# Patient Record
Sex: Male | Born: 1940 | Race: White | Hispanic: No | Marital: Married | Smoking: Former smoker
Health system: Southern US, Community
[De-identification: ages and names within clinical notes are randomized; demographics above are authoritative.]

## PROBLEM LIST (undated history)

## (undated) DIAGNOSIS — E785 Hyperlipidemia, unspecified: Secondary | ICD-10-CM

## (undated) DIAGNOSIS — J302 Other seasonal allergic rhinitis: Secondary | ICD-10-CM

## (undated) DIAGNOSIS — I10 Essential (primary) hypertension: Secondary | ICD-10-CM

## (undated) DIAGNOSIS — E119 Type 2 diabetes mellitus without complications: Secondary | ICD-10-CM

## (undated) DIAGNOSIS — N529 Male erectile dysfunction, unspecified: Secondary | ICD-10-CM

## (undated) DIAGNOSIS — M1990 Unspecified osteoarthritis, unspecified site: Secondary | ICD-10-CM

## (undated) HISTORY — PX: TONSILLECTOMY: SUR1361

## (undated) HISTORY — PX: HAND SURGERY: SHX662

## (undated) HISTORY — PX: CATARACT EXTRACTION: SUR2

## (undated) HISTORY — PX: VASECTOMY: SHX75

## (undated) HISTORY — PX: APPENDECTOMY: SHX54

---

## 2005-12-20 ENCOUNTER — Ambulatory Visit: Payer: Self-pay | Admitting: Orthopaedic Surgery

## 2005-12-20 ENCOUNTER — Other Ambulatory Visit: Payer: Self-pay

## 2005-12-22 ENCOUNTER — Ambulatory Visit: Payer: Self-pay | Admitting: Orthopaedic Surgery

## 2009-07-29 ENCOUNTER — Ambulatory Visit: Payer: Self-pay | Admitting: Unknown Physician Specialty

## 2010-08-25 ENCOUNTER — Emergency Department: Payer: Self-pay | Admitting: *Deleted

## 2010-09-02 ENCOUNTER — Emergency Department: Payer: Self-pay | Admitting: Emergency Medicine

## 2011-05-14 ENCOUNTER — Ambulatory Visit: Payer: Self-pay | Admitting: Gastroenterology

## 2011-07-26 ENCOUNTER — Ambulatory Visit: Payer: Self-pay | Admitting: Gastroenterology

## 2011-10-20 ENCOUNTER — Telehealth: Payer: Self-pay

## 2011-10-20 DIAGNOSIS — K6289 Other specified diseases of anus and rectum: Secondary | ICD-10-CM

## 2011-10-21 ENCOUNTER — Other Ambulatory Visit: Payer: Self-pay

## 2011-10-21 DIAGNOSIS — K6289 Other specified diseases of anus and rectum: Secondary | ICD-10-CM

## 2011-10-21 NOTE — Telephone Encounter (Signed)
Pt has been instructed and meds reviewed and pt will call with any questions or concerns

## 2011-10-21 NOTE — Telephone Encounter (Signed)
Left message on machine to call back  

## 2011-12-02 ENCOUNTER — Ambulatory Visit (HOSPITAL_COMMUNITY)
Admission: RE | Admit: 2011-12-02 | Discharge: 2011-12-02 | Disposition: A | Discharge status: Home / Self Care | Payer: Medicare Other | Source: Ambulatory Visit | Attending: Gastroenterology | Admitting: Gastroenterology

## 2011-12-02 ENCOUNTER — Encounter (HOSPITAL_COMMUNITY): Payer: Self-pay | Admitting: *Deleted

## 2011-12-02 ENCOUNTER — Encounter (HOSPITAL_COMMUNITY)
Admission: RE | Disposition: A | Discharge status: Home / Self Care | Payer: Self-pay | Source: Ambulatory Visit | Attending: Gastroenterology

## 2011-12-02 DIAGNOSIS — K620 Anal polyp: Secondary | ICD-10-CM | POA: Insufficient documentation

## 2011-12-02 DIAGNOSIS — R933 Abnormal findings on diagnostic imaging of other parts of digestive tract: Secondary | ICD-10-CM

## 2011-12-02 DIAGNOSIS — K6289 Other specified diseases of anus and rectum: Secondary | ICD-10-CM | POA: Insufficient documentation

## 2011-12-02 DIAGNOSIS — E119 Type 2 diabetes mellitus without complications: Secondary | ICD-10-CM | POA: Insufficient documentation

## 2011-12-02 DIAGNOSIS — I10 Essential (primary) hypertension: Secondary | ICD-10-CM | POA: Insufficient documentation

## 2011-12-02 DIAGNOSIS — E785 Hyperlipidemia, unspecified: Secondary | ICD-10-CM | POA: Insufficient documentation

## 2011-12-02 DIAGNOSIS — K621 Rectal polyp: Secondary | ICD-10-CM | POA: Insufficient documentation

## 2011-12-02 DIAGNOSIS — D214 Benign neoplasm of connective and other soft tissue of abdomen: Secondary | ICD-10-CM | POA: Insufficient documentation

## 2011-12-02 HISTORY — DX: Hyperlipidemia, unspecified: E78.5

## 2011-12-02 HISTORY — DX: Unspecified osteoarthritis, unspecified site: M19.90

## 2011-12-02 HISTORY — DX: Essential (primary) hypertension: I10

## 2011-12-02 HISTORY — PX: EUS: SHX5427

## 2011-12-02 HISTORY — DX: Type 2 diabetes mellitus without complications: E11.9

## 2011-12-02 SURGERY — ULTRASOUND, LOWER GI TRACT, ENDOSCOPIC
Anesthesia: Moderate Sedation

## 2011-12-02 MED ORDER — FENTANYL CITRATE 0.05 MG/ML IJ SOLN
INTRAMUSCULAR | Status: DC | PRN
  Administered 2011-12-02 (×3): 25 ug via INTRAVENOUS

## 2011-12-02 MED ORDER — MIDAZOLAM HCL 10 MG/2ML IJ SOLN
INTRAMUSCULAR | Status: DC | PRN
  Administered 2011-12-02 (×3): 2 mg via INTRAVENOUS

## 2011-12-02 MED ORDER — MIDAZOLAM HCL 10 MG/2ML IJ SOLN
INTRAMUSCULAR | Status: AC
  Filled 2011-12-02: qty 4

## 2011-12-02 MED ORDER — FENTANYL CITRATE 0.05 MG/ML IJ SOLN
INTRAMUSCULAR | Status: AC
  Filled 2011-12-02: qty 4

## 2011-12-02 MED ORDER — SODIUM CHLORIDE 0.9 % IV SOLN
INTRAVENOUS | Status: DC
  Administered 2011-12-02: 10:00:00 via INTRAVENOUS

## 2011-12-02 NOTE — H&P (Signed)
  HPI: This is a very pleasant man found to have rectosigmoid nodule 4/13 on first colonsocopy.  Follow up flex sig 7/13 showed the same wihtout changes.  Mucosal biopsies show no neoplasm    Past Medical History  Diagnosis Date  . Diabetes mellitus without complication   . Hypertension   . Hyperlipidemia   . Arthritis     hands, shoulder    Past Surgical History  Procedure Date  . Tonsillectomy   . Appendectomy   . Hand surgery     right hand, nodule removed on finger     Current Facility-Administered Medications  Medication Dose Route Frequency Provider Last Rate Last Dose  . 0.9 %  sodium chloride infusion   Intravenous Continuous Rachael Fee, MD 20 mL/hr at 12/02/11 0940      Allergies as of 10/21/2011  . (No Known Allergies)    History reviewed. No pertinent family history.  History   Social History  . Marital Status: Married    Spouse Name: N/A    Number of Children: N/A  . Years of Education: N/A   Occupational History  . Not on file.   Social History Main Topics  . Smoking status: Former Games developer  . Smokeless tobacco: Not on file  . Alcohol Use: 12.6 oz/week    21 Glasses of wine per week     Comment: every day  . Drug Use: No  . Sexually Active:    Other Topics Concern  . Not on file   Social History Narrative  . No narrative on file      Physical Exam: BP 177/97  Temp 97.5 F (36.4 C) (Oral)  Resp 13  Ht 5\' 8"  (1.727 m)  Wt 198 lb (89.812 kg)  BMI 30.11 kg/m2  SpO2 98% Constitutional: generally well-appearing Psychiatric: alert and oriented x3 Abdomen: soft, nontender, nondistended, no obvious ascites, no peritoneal signs, normal bowel sounds     Assessment and plan: 71 y.o. male with rectosigmoid nodule  For lower EUS today

## 2011-12-02 NOTE — Op Note (Signed)
[facility] [address] [place], [address]   ENDOSCOPIC ULTRASOUND PROCEDURE REPORT  PATIENT: Curtis, Krueger  MR#: [id number] BIRTHDATE: 08-05-1940  GENDER: Male ENDOSCOPIST: Rachael Fee, MD REFERRED BY:  Barnetta Chapel, MD at [facility] in [place], [place] PROCEDURE DATE:  12/02/2011 PROCEDURE:   Lower EUS, flex sig with polypectomy ASA CLASS:      Class II INDICATIONS:   rectosigmoid nodule noted on colonsocopy 04/2011, repeat exam 07/2011 (Dr.  Marva Panda). MEDICATIONS: Fentanyl 75 mcg IV and Versed 6 mg IV  DESCRIPTION OF PROCEDURE:   After the risks benefits and alternatives of the procedure were  explained, informed consent was obtained. The patient was then placed in the left, lateral, decubitus postion and IV sedation was administered. Throughout the procedure, the patients blood pressure, pulse and oxygen saturations were monitored continuously.  Under direct visualization, the EUS 110107  endoscope was introduced through the mouth  and advanced to the descending duodenum .  Water was used as necessary to provide an acoustic interface.  Upon completion of the imaging, water was removed and the patient was sent to the recovery room in satisfactory condition.    Sigmoidoscopic findings: 1. Polypoid nodule at 15cm from anus (recto-sigmoid verge). This was 8mm across. 2. Scattered 1mm hyperplastic appearing rectal polyps.  Otherwise, repeated examination to 35cm from anus found no other lesions.  EUS findings: 1. The nodule above correlated with a hypoechoic 5.74mm lesion, confined to mucosa layer of rectal wall. 2. No perirectal adenoapthy.  Following EUS examination, I elected to removed with nodule with snare/cautery and sent to pathology.  Await final pathology.    _______________________________ eSignedRachael Fee, MD 12/02/2011 10:27 AM

## 2011-12-03 ENCOUNTER — Encounter (HOSPITAL_COMMUNITY): Payer: Self-pay

## 2011-12-03 ENCOUNTER — Encounter (HOSPITAL_COMMUNITY): Payer: Self-pay | Admitting: Gastroenterology

## 2013-02-06 ENCOUNTER — Ambulatory Visit (INDEPENDENT_AMBULATORY_CARE_PROVIDER_SITE_OTHER): Payer: Medicare Other | Admitting: Podiatry

## 2013-02-06 ENCOUNTER — Encounter: Payer: Self-pay | Admitting: Podiatry

## 2013-02-06 ENCOUNTER — Ambulatory Visit (INDEPENDENT_AMBULATORY_CARE_PROVIDER_SITE_OTHER): Payer: Medicare Other

## 2013-02-06 VITALS — BP 148/78 | HR 94 | Resp 16 | Ht 68.0 in | Wt 197.0 lb

## 2013-02-06 DIAGNOSIS — M79609 Pain in unspecified limb: Secondary | ICD-10-CM

## 2013-02-06 DIAGNOSIS — M79671 Pain in right foot: Secondary | ICD-10-CM

## 2013-02-06 DIAGNOSIS — M722 Plantar fascial fibromatosis: Secondary | ICD-10-CM

## 2013-02-06 MED ORDER — TRIAMCINOLONE ACETONIDE 10 MG/ML IJ SUSP
10.0000 mg | Freq: Once | INTRAMUSCULAR | Status: AC
  Administered 2013-02-06: 10 mg

## 2013-02-06 NOTE — Patient Instructions (Signed)
Plantar Fasciitis (Heel Spur Syndrome)  with Rehab  The plantar fascia is a fibrous, ligament-like, soft-tissue structure that spans the bottom of the foot. Plantar fasciitis is a condition that causes pain in the foot due to inflammation of the tissue.  SYMPTOMS   · Pain and tenderness on the underneath side of the foot.  · Pain that worsens with standing or walking.  CAUSES   Plantar fasciitis is caused by irritation and injury to the plantar fascia on the underneath side of the foot. Common mechanisms of injury include:  · Direct trauma to bottom of the foot.  · Damage to a small nerve that runs under the foot where the main fascia attaches to the heel bone.  · Stress placed on the plantar fascia due to bone spurs.  RISK INCREASES WITH:   · Activities that place stress on the plantar fascia (running, jumping, pivoting, or cutting).  · Poor strength and flexibility.  · Improperly fitted shoes.  · Tight calf muscles.  · Flat feet.  · Failure to warm-up properly before activity.  · Obesity.  PREVENTION  · Warm up and stretch properly before activity.  · Allow for adequate recovery between workouts.  · Maintain physical fitness:  · Strength, flexibility, and endurance.  · Cardiovascular fitness.  · Maintain a health body weight.  · Avoid stress on the plantar fascia.  · Wear properly fitted shoes, including arch supports for individuals who have flat feet.  PROGNOSIS   If treated properly, then the symptoms of plantar fasciitis usually resolve without surgery. However, occasionally surgery is necessary.  RELATED COMPLICATIONS   · Recurrent symptoms that may result in a chronic condition.  · Problems of the lower back that are caused by compensating for the injury, such as limping.  · Pain or weakness of the foot during push-off following surgery.  · Chronic inflammation, scarring, and partial or complete fascia tear, occurring more often from repeated injections.  TREATMENT   Treatment initially involves the use of  ice and medication to help reduce pain and inflammation. The use of strengthening and stretching exercises may help reduce pain with activity, especially stretches of the Achilles tendon. These exercises may be performed at home or with a therapist. Your caregiver may recommend that you use heel cups of arch supports to help reduce stress on the plantar fascia. Occasionally, corticosteroid injections are given to reduce inflammation. If symptoms persist for greater than 6 months despite non-surgical (conservative), then surgery may be recommended.   MEDICATION   · If pain medication is necessary, then nonsteroidal anti-inflammatory medications, such as aspirin and ibuprofen, or other minor pain relievers, such as acetaminophen, are often recommended.  · Do not take pain medication within 7 days before surgery.  · Prescription pain relievers may be given if deemed necessary by your caregiver. Use only as directed and only as much as you need.  · Corticosteroid injections may be given by your caregiver. These injections should be reserved for the most serious cases, because they may only be given a certain number of times.  HEAT AND COLD  · Cold treatment (icing) relieves pain and reduces inflammation. Cold treatment should be applied for 10 to 15 minutes every 2 to 3 hours for inflammation and pain and immediately after any activity that aggravates your symptoms. Use ice packs or massage the area with a piece of ice (ice massage).  · Heat treatment may be used prior to performing the stretching and strengthening activities prescribed   by your caregiver, physical therapist, or athletic trainer. Use a heat pack or soak the injury in warm water.  SEEK IMMEDIATE MEDICAL CARE IF:  · Treatment seems to offer no benefit, or the condition worsens.  · Any medications produce adverse side effects.  EXERCISES  RANGE OF MOTION (ROM) AND STRETCHING EXERCISES - Plantar Fasciitis (Heel Spur Syndrome)  These exercises may help you  when beginning to rehabilitate your injury. Your symptoms may resolve with or without further involvement from your physician, physical therapist or athletic trainer. While completing these exercises, remember:   · Restoring tissue flexibility helps normal motion to return to the joints. This allows healthier, less painful movement and activity.  · An effective stretch should be held for at least 30 seconds.  · A stretch should never be painful. You should only feel a gentle lengthening or release in the stretched tissue.  RANGE OF MOTION - Toe Extension, Flexion  · Sit with your right / left leg crossed over your opposite knee.  · Grasp your toes and gently pull them back toward the top of your foot. You should feel a stretch on the bottom of your toes and/or foot.  · Hold this stretch for __________ seconds.  · Now, gently pull your toes toward the bottom of your foot. You should feel a stretch on the top of your toes and or foot.  · Hold this stretch for __________ seconds.  Repeat __________ times. Complete this stretch __________ times per day.   RANGE OF MOTION - Ankle Dorsiflexion, Active Assisted  · Remove shoes and sit on a chair that is preferably not on a carpeted surface.  · Place right / left foot under knee. Extend your opposite leg for support.  · Keeping your heel down, slide your right / left foot back toward the chair until you feel a stretch at your ankle or calf. If you do not feel a stretch, slide your bottom forward to the edge of the chair, while still keeping your heel down.  · Hold this stretch for __________ seconds.  Repeat __________ times. Complete this stretch __________ times per day.   STRETCH  Gastroc, Standing  · Place hands on wall.  · Extend right / left leg, keeping the front knee somewhat bent.  · Slightly point your toes inward on your back foot.  · Keeping your right / left heel on the floor and your knee straight, shift your weight toward the wall, not allowing your back to  arch.  · You should feel a gentle stretch in the right / left calf. Hold this position for __________ seconds.  Repeat __________ times. Complete this stretch __________ times per day.  STRETCH  Soleus, Standing  · Place hands on wall.  · Extend right / left leg, keeping the other knee somewhat bent.  · Slightly point your toes inward on your back foot.  · Keep your right / left heel on the floor, bend your back knee, and slightly shift your weight over the back leg so that you feel a gentle stretch deep in your back calf.  · Hold this position for __________ seconds.  Repeat __________ times. Complete this stretch __________ times per day.  STRETCH  Gastrocsoleus, Standing   Note: This exercise can place a lot of stress on your foot and ankle. Please complete this exercise only if specifically instructed by your caregiver.   · Place the ball of your right / left foot on a step, keeping   your other foot firmly on the same step.  · Hold on to the wall or a rail for balance.  · Slowly lift your other foot, allowing your body weight to press your heel down over the edge of the step.  · You should feel a stretch in your right / left calf.  · Hold this position for __________ seconds.  · Repeat this exercise with a slight bend in your right / left knee.  Repeat __________ times. Complete this stretch __________ times per day.   STRENGTHENING EXERCISES - Plantar Fasciitis (Heel Spur Syndrome)   These exercises may help you when beginning to rehabilitate your injury. They may resolve your symptoms with or without further involvement from your physician, physical therapist or athletic trainer. While completing these exercises, remember:   · Muscles can gain both the endurance and the strength needed for everyday activities through controlled exercises.  · Complete these exercises as instructed by your physician, physical therapist or athletic trainer. Progress the resistance and repetitions only as guided.  STRENGTH - Towel  Curls  · Sit in a chair positioned on a non-carpeted surface.  · Place your foot on a towel, keeping your heel on the floor.  · Pull the towel toward your heel by only curling your toes. Keep your heel on the floor.  · If instructed by your physician, physical therapist or athletic trainer, add ____________________ at the end of the towel.  Repeat __________ times. Complete this exercise __________ times per day.  STRENGTH - Ankle Inversion  · Secure one end of a rubber exercise band/tubing to a fixed object (table, pole). Loop the other end around your foot just before your toes.  · Place your fists between your knees. This will focus your strengthening at your ankle.  · Slowly, pull your big toe up and in, making sure the band/tubing is positioned to resist the entire motion.  · Hold this position for __________ seconds.  · Have your muscles resist the band/tubing as it slowly pulls your foot back to the starting position.  Repeat __________ times. Complete this exercises __________ times per day.   Document Released: 01/04/2005 Document Revised: 03/29/2011 Document Reviewed: 04/18/2008  ExitCare® Patient Information ©2014 [employer].

## 2013-02-06 NOTE — Progress Notes (Signed)
   Subjective:    Patient ID: Curtis Krueger, male    DOB: Jul 02, 1940, 73 y.o.   MRN: [id number]  HPI Comments: N pain L right heel D 1 month O slowly C same A am bad, walking T rolls on ice bottle, heel brace  Foot Pain      Review of Systems  Constitutional: Negative.   Eyes: Negative.   Cardiovascular: Negative.   Gastrointestinal: Negative.   Endocrine: Negative.   Genitourinary: Negative.   Musculoskeletal: Negative.        Joint pain  Skin: Negative.   Allergic/Immunologic: Negative.   Neurological: Negative.   Hematological: Negative.   Psychiatric/Behavioral: Negative.        Objective:   Physical Exam        Assessment & Plan:

## 2013-02-06 NOTE — Progress Notes (Signed)
Subjective:     Patient ID: Curtis Krueger, male   DOB: 1940-06-18, 73 y.o.   MRN: [id number]  Foot Pain   patient presents with pain of the right heel of one-month duration which gradual increase over the last several weeks. He states it hurts more after sitting or in the morning or with intense activity   Review of Systems  All other systems reviewed and are negative.       Objective:   Physical Exam  Nursing note and vitals reviewed. Cardiovascular: Intact distal pulses.   Musculoskeletal: Normal range of motion.  Neurological: He is alert.  Skin: Skin is warm.   neurovascular status found to be intact with discomfort in the plantar heel right at the insertional point of the tendon into the calcaneus. No equinus condition noted and muscle strength adequate     Assessment:     Plantar fasciitis of the right heel that is quite intense upon palpation    Plan:     H&P and x-ray reviewed. Injected the plantar fascia 3 mg Kenalog 5 g Xylocaine Marcaine mixture and applied fascially brace with instructions on physical therapy. Reappoint as needed

## 2013-12-22 ENCOUNTER — Ambulatory Visit: Payer: Self-pay | Admitting: Emergency Medicine

## 2014-07-11 ENCOUNTER — Other Ambulatory Visit: Payer: Self-pay | Admitting: Surgery

## 2014-07-11 DIAGNOSIS — M19011 Primary osteoarthritis, right shoulder: Secondary | ICD-10-CM

## 2014-07-11 DIAGNOSIS — M75101 Unspecified rotator cuff tear or rupture of right shoulder, not specified as traumatic: Secondary | ICD-10-CM

## 2014-07-13 ENCOUNTER — Ambulatory Visit: Payer: Medicare Other

## 2014-07-13 ENCOUNTER — Ambulatory Visit: Payer: BLUE CROSS/BLUE SHIELD

## 2014-07-16 ENCOUNTER — Other Ambulatory Visit: Payer: Self-pay | Admitting: Unknown Physician Specialty

## 2014-07-16 ENCOUNTER — Ambulatory Visit: Payer: BLUE CROSS/BLUE SHIELD

## 2014-07-16 DIAGNOSIS — M25562 Pain in left knee: Secondary | ICD-10-CM

## 2014-07-17 ENCOUNTER — Ambulatory Visit: Payer: Medicare Other

## 2014-07-19 ENCOUNTER — Ambulatory Visit
Admission: RE | Admit: 2014-07-19 | Discharge: 2014-07-19 | Disposition: A | Discharge status: Home / Self Care | Payer: Medicare Other | Source: Ambulatory Visit | Attending: Surgery | Admitting: Surgery

## 2014-07-19 ENCOUNTER — Ambulatory Visit
Admission: RE | Admit: 2014-07-19 | Discharge: 2014-07-19 | Disposition: A | Discharge status: Home / Self Care | Payer: Medicare Other | Source: Ambulatory Visit | Attending: Unknown Physician Specialty | Admitting: Unknown Physician Specialty

## 2014-07-19 DIAGNOSIS — M75101 Unspecified rotator cuff tear or rupture of right shoulder, not specified as traumatic: Secondary | ICD-10-CM | POA: Diagnosis present

## 2014-07-19 DIAGNOSIS — M25562 Pain in left knee: Secondary | ICD-10-CM | POA: Diagnosis present

## 2014-07-19 DIAGNOSIS — M19011 Primary osteoarthritis, right shoulder: Secondary | ICD-10-CM | POA: Diagnosis not present

## 2014-07-19 DIAGNOSIS — S83242A Other tear of medial meniscus, current injury, left knee, initial encounter: Secondary | ICD-10-CM | POA: Insufficient documentation

## 2014-07-19 DIAGNOSIS — X58XXXA Exposure to other specified factors, initial encounter: Secondary | ICD-10-CM | POA: Diagnosis not present

## 2014-07-19 DIAGNOSIS — M75111 Incomplete rotator cuff tear or rupture of right shoulder, not specified as traumatic: Secondary | ICD-10-CM | POA: Diagnosis not present

## 2014-07-19 DIAGNOSIS — S83282A Other tear of lateral meniscus, current injury, left knee, initial encounter: Secondary | ICD-10-CM | POA: Diagnosis not present

## 2014-07-19 DIAGNOSIS — M1990 Unspecified osteoarthritis, unspecified site: Secondary | ICD-10-CM | POA: Diagnosis present

## 2014-09-17 ENCOUNTER — Ambulatory Visit
Admission: RE | Admit: 2014-09-17 | Discharge: 2014-09-17 | Disposition: A | Discharge status: Home / Self Care | Payer: Medicare Other | Source: Ambulatory Visit | Attending: Anesthesiology | Admitting: Anesthesiology

## 2014-09-17 ENCOUNTER — Encounter: Payer: Self-pay | Admitting: *Deleted

## 2014-09-17 DIAGNOSIS — I10 Essential (primary) hypertension: Secondary | ICD-10-CM | POA: Diagnosis present

## 2014-09-27 ENCOUNTER — Ambulatory Visit
Admission: RE | Admit: 2014-09-27 | Discharge: 2014-09-27 | Disposition: A | Discharge status: Home / Self Care | Payer: Medicare Other | Source: Ambulatory Visit | Attending: Unknown Physician Specialty | Admitting: Unknown Physician Specialty

## 2014-09-27 ENCOUNTER — Encounter
Admission: RE | Disposition: A | Discharge status: Home / Self Care | Payer: Self-pay | Source: Ambulatory Visit | Attending: Unknown Physician Specialty

## 2014-09-27 ENCOUNTER — Ambulatory Visit: Payer: Medicare Other | Admitting: Anesthesiology

## 2014-09-27 DIAGNOSIS — M1990 Unspecified osteoarthritis, unspecified site: Secondary | ICD-10-CM | POA: Insufficient documentation

## 2014-09-27 DIAGNOSIS — Z9841 Cataract extraction status, right eye: Secondary | ICD-10-CM | POA: Insufficient documentation

## 2014-09-27 DIAGNOSIS — Z87891 Personal history of nicotine dependence: Secondary | ICD-10-CM | POA: Diagnosis not present

## 2014-09-27 DIAGNOSIS — Z79899 Other long term (current) drug therapy: Secondary | ICD-10-CM | POA: Insufficient documentation

## 2014-09-27 DIAGNOSIS — M25562 Pain in left knee: Secondary | ICD-10-CM | POA: Diagnosis present

## 2014-09-27 DIAGNOSIS — E780 Pure hypercholesterolemia: Secondary | ICD-10-CM | POA: Diagnosis not present

## 2014-09-27 DIAGNOSIS — M948X6 Other specified disorders of cartilage, lower leg: Secondary | ICD-10-CM | POA: Diagnosis not present

## 2014-09-27 DIAGNOSIS — Z809 Family history of malignant neoplasm, unspecified: Secondary | ICD-10-CM | POA: Insufficient documentation

## 2014-09-27 DIAGNOSIS — M23222 Derangement of posterior horn of medial meniscus due to old tear or injury, left knee: Secondary | ICD-10-CM | POA: Insufficient documentation

## 2014-09-27 DIAGNOSIS — Z8249 Family history of ischemic heart disease and other diseases of the circulatory system: Secondary | ICD-10-CM | POA: Diagnosis not present

## 2014-09-27 DIAGNOSIS — E119 Type 2 diabetes mellitus without complications: Secondary | ICD-10-CM | POA: Diagnosis not present

## 2014-09-27 DIAGNOSIS — I10 Essential (primary) hypertension: Secondary | ICD-10-CM | POA: Diagnosis not present

## 2014-09-27 HISTORY — PX: KNEE ARTHROSCOPY: SHX127

## 2014-09-27 LAB — GLUCOSE, CAPILLARY
GLUCOSE-CAPILLARY: 136 mg/dL — AB (ref 65–99)
Glucose-Capillary: 104 mg/dL — ABNORMAL HIGH (ref 65–99)

## 2014-09-27 SURGERY — ARTHROSCOPY, KNEE
Anesthesia: General | Laterality: Left | Wound class: Clean

## 2014-09-27 MED ORDER — MIDAZOLAM HCL 5 MG/5ML IJ SOLN
INTRAMUSCULAR | Status: DC | PRN
  Administered 2014-09-27: 2 mg via INTRAVENOUS

## 2014-09-27 MED ORDER — OXYCODONE HCL 5 MG/5ML PO SOLN: 5.0000 mg | Freq: Once | ORAL | Status: DC | PRN

## 2014-09-27 MED ORDER — LACTATED RINGERS IV SOLN
INTRAVENOUS | Status: DC
  Administered 2014-09-27: 07:00:00 via INTRAVENOUS

## 2014-09-27 MED ORDER — DEXAMETHASONE SODIUM PHOSPHATE 4 MG/ML IJ SOLN
INTRAMUSCULAR | Status: DC | PRN
  Administered 2014-09-27: 8 mg via INTRAVENOUS

## 2014-09-27 MED ORDER — ONDANSETRON HCL 4 MG/2ML IJ SOLN
INTRAMUSCULAR | Status: DC | PRN
  Administered 2014-09-27: 4 mg via INTRAVENOUS

## 2014-09-27 MED ORDER — FENTANYL CITRATE (PF) 100 MCG/2ML IJ SOLN
INTRAMUSCULAR | Status: DC | PRN
  Administered 2014-09-27 (×2): 50 ug via INTRAVENOUS

## 2014-09-27 MED ORDER — OXYCODONE HCL 5 MG PO TABS: 5.0000 mg | ORAL_TABLET | Freq: Once | ORAL | Status: DC | PRN

## 2014-09-27 MED ORDER — LIDOCAINE HCL (CARDIAC) 20 MG/ML IV SOLN
INTRAVENOUS | Status: DC | PRN
  Administered 2014-09-27: 40 mg via INTRATRACHEAL

## 2014-09-27 MED ORDER — PROPOFOL 10 MG/ML IV BOLUS
INTRAVENOUS | Status: DC | PRN
  Administered 2014-09-27: 150 mg via INTRAVENOUS

## 2014-09-27 MED ORDER — BUPIVACAINE HCL (PF) 0.5 % IJ SOLN
INTRAMUSCULAR | Status: DC | PRN
  Administered 2014-09-27: 20 mL

## 2014-09-27 MED ORDER — NORCO 5-325 MG PO TABS: 1.0000 | ORAL_TABLET | Freq: Four times a day (QID) | ORAL | Status: DC | PRN

## 2014-09-27 SURGICAL SUPPLY — 41 items
ARTHROWAND PARAGON T2 (SURGICAL WAND) ×3
BLADE ABRADER 4.5 (BLADE) ×3 IMPLANT
BLADE FULL RADIUS 3.5 (BLADE) ×3 IMPLANT
BUR RADIUS 3.5 (BURR) IMPLANT
BUR RADIUS 4.0X18.5 (BURR) IMPLANT
BUR ROUND 5.5 (BURR) IMPLANT
BURR ROUND 12 FLUTE 4.0MM (BURR) IMPLANT
COVER LIGHT HANDLE FLEXIBLE (MISCELLANEOUS) ×3 IMPLANT
CUFF TOURN SGL QUICK 24 (TOURNIQUET CUFF) ×2
CUFF TOURN SGL QUICK 30 (MISCELLANEOUS)
CUFF TOURN SGL QUICK 34 (TOURNIQUET CUFF)
CUFF TRNQT CYL 24X4X40X1 (TOURNIQUET CUFF) ×1 IMPLANT
CUFF TRNQT CYL 34X4X40X1 (TOURNIQUET CUFF) IMPLANT
CUFF TRNQT CYL LO 30X4X (MISCELLANEOUS) IMPLANT
CUTTER SLOTTED WHISKER 4.0 (BURR) IMPLANT
DRAPE LEGGINS SURG 28X43 STRL (DRAPES) ×3 IMPLANT
DURAPREP 26ML APPLICATOR (WOUND CARE) ×3 IMPLANT
GAUZE SPONGE 4X4 12PLY STRL (GAUZE/BANDAGES/DRESSINGS) ×3 IMPLANT
GLOVE BIO SURGEON STRL SZ7.5 (GLOVE) ×3 IMPLANT
GLOVE BIO SURGEON STRL SZ8 (GLOVE) ×3 IMPLANT
GLOVE INDICATOR 8.0 STRL GRN (GLOVE) ×3 IMPLANT
GOWN STRL REIN 2XL XLG LVL4 (GOWN DISPOSABLE) ×3 IMPLANT
GOWN STRL REUS W/TWL 2XL LVL3 (GOWN DISPOSABLE) ×3 IMPLANT
IV LACTATED RINGER IRRG 3000ML (IV SOLUTION) ×4
IV LR IRRIG 3000ML ARTHROMATIC (IV SOLUTION) ×2 IMPLANT
MANIFOLD 4PT FOR NEPTUNE1 (MISCELLANEOUS) ×3 IMPLANT
PACK ARTHROSCOPY KNEE (MISCELLANEOUS) ×3 IMPLANT
SET TUBE SUCT SHAVER OUTFL 24K (TUBING) ×3 IMPLANT
SOL PREP PVP 2OZ (MISCELLANEOUS) ×3
SOLUTION PREP PVP 2OZ (MISCELLANEOUS) ×1 IMPLANT
SUT ETHILON 3-0 FS-10 30 BLK (SUTURE) ×3
SUTURE EHLN 3-0 FS-10 30 BLK (SUTURE) ×1 IMPLANT
TAPE MICROFOAM 4IN (TAPE) ×3 IMPLANT
TUBING ARTHRO INFLOW-ONLY STRL (TUBING) ×3 IMPLANT
WAND ARTHRO PARAGON T2 (SURGICAL WAND) ×1 IMPLANT
WAND COVAC 50 IFS (MISCELLANEOUS) IMPLANT
WAND HAND CNTRL MULTIVAC 50 (MISCELLANEOUS) ×3 IMPLANT
WAND HAND CNTRL MULTIVAC 90 (MISCELLANEOUS) IMPLANT
WAND MEGAVAC 90 (MISCELLANEOUS) IMPLANT
WAND ULTRAVAC 90 (MISCELLANEOUS) IMPLANT
WRAP KNEE W/COLD PACKS 25.5X14 (SOFTGOODS) ×3 IMPLANT

## 2014-09-27 NOTE — Anesthesia Procedure Notes (Signed)
Procedure Name: LMA Insertion Date/Time: 09/27/2014 7:34 AM Performed by: Londell Moh Pre-anesthesia Checklist: Patient identified, Emergency Drugs available, Suction available, Timeout performed and Patient being monitored Patient Re-evaluated:Patient Re-evaluated prior to inductionOxygen Delivery Method: Circle system utilized Preoxygenation: Pre-oxygenation with 100% oxygen Intubation Type: IV induction LMA: LMA inserted LMA Size: 4.0 Number of attempts: 1 Placement Confirmation: positive ETCO2 and breath sounds checked- equal and bilateral Tube secured with: Tape

## 2014-09-27 NOTE — Op Note (Signed)
Patient: Curtis Krueger  Preoperative diagnosis: Torn medial meniscus left knee along with medial femoral chondral lesion  Postop diagnosis: Same  Operation: Arthroscopic partial medial meniscectomy plus debridement and Coblation of the medial femoral chondral lesion  Surgeon: Vilinda Flake, MD  Anesthesia: Gen.   History: Patient's had a long history of left knee pain.  The plain films revealed mild medial compartment narrowing .  The patient had an MRI which revealed torn medial meniscus plus medial femoral chondral lesion.The patient was scheduled for surgery due to persistent discomfort despite conservative treatment.  The patient was taken the operating room where satisfactory general anesthesia was achieved. A tourniquet and leg holder were was applied to the left thigh. The right lower extremity was supported with a well leg holder. The left knee was prepped and draped in usual fashion for an arthroscopic procedure. An inflow cannula was introduced superomedially. The joint was distended with lactated Ringer's. Scope was introduced through an inferolateral puncture wound and a probe through an inferomedial puncture wound. Inspection of the medial compartment revealed  degenerative type tear of the posterior horn of the medial meniscus along with a grade 3 chondral lesion in the mid weightbearing portion of the medial femoral condyle. The lesion was probably about 1.5-2 cm in diameter. I went ahead and resected the medial meniscus tear using combination of basket biters and a motorized resector. The remaining rim was contoured with an angled ArthroCare wand. I then debrided the medial femoral chondral lesion with a turbo whisker blade and coblalated the lesion with an ArthroCare Paragon wand. Inspection of the intercondylar notch revealed intact cruciates. Inspection of the the lateral compartment revealed no significant chondral or meniscal pathology.   Trochlear groove was inspected and  appeared to be fairly smooth.  Retropatellar surface was only mildly fibrillated. The patella seemed to track fairly well.  The instruments were removed from the joint at this time. The puncture wounds were closed with 3-0 nylon in vertical mattress fashion. I injected each puncture wound with several cc of half percent Marcaine without epinephrine. Betadine was applied the wounds followed by sterile dressing. An ice pack was applied to the right knee. The patient was awakened and transferred to the stretcher bed. The patient was taken to the recovery room in satisfactory condition.  The tourniquet was not inflated during the course of the procedure. Blood loss was negligible.

## 2014-09-27 NOTE — Discharge Instructions (Signed)
General Anesthesia, Care After °Refer to this sheet in the next few weeks. These instructions provide you with information on caring for yourself after your procedure. Your health care provider may also give you more specific instructions. Your treatment has been planned according to current medical practices, but problems sometimes occur. Call your health care provider if you have any problems or questions after your procedure. °WHAT TO EXPECT AFTER THE PROCEDURE °After the procedure, it is typical to experience: °· Sleepiness. °· Nausea and vomiting. °HOME CARE INSTRUCTIONS °· For the first 24 hours after general anesthesia: °¨ Have a responsible person with you. °¨ Do not drive a car. If you are alone, do not take public transportation. °¨ Do not drink alcohol. °¨ Do not take medicine that has not been prescribed by your health care provider. °¨ Do not sign important papers or make important decisions. °¨ You may resume a normal diet and activities as directed by your health care provider. °· Change bandages (dressings) as directed. °· If you have questions or problems that seem related to general anesthesia, call the hospital and ask for the anesthetist or anesthesiologist on call. °SEEK MEDICAL CARE IF: °· You have nausea and vomiting that continue the day after anesthesia. °· You develop a rash. °SEEK IMMEDIATE MEDICAL CARE IF:  °· You have difficulty breathing. °· You have chest pain. °· You have any allergic problems. °Document Released: 04/12/2000 Document Revised: 01/09/2013 Document Reviewed: 07/20/2012 °ExitCare® Patient Information ©2015 [employer]. This information is not intended to replace advice given to you by your health care provider. Make sure you discuss any questions you have with your health care provider. ° ° °[facility]  °Dickey Caamano B. Allon Costlow, Jr., M.D. [phone]  ° °KNEE ARTHROSCOPY POST OPERATION INSTRUCTIONS: ° °PLEASE READ THESE INSTRUCTIONS  ABOUT POST OPERATION CARE. THEY WILL ANSWER MOST OF YOUR QUESTIONS.  °You have been given a prescription for pain. Please take as directed for pain.  °You can walk, keeping the knee slightly stiff-avoid doing too much bending the first day. (if ACL reconstruction is performed, keep brace locked in extension when walking.)  °You will use crutches or cane if needed. Can weight bear as tolerated  °Plan to take three to four days off from work. You can resume work when you are comfortable. (This can be a week or more, depending on the type of work you do.)  °To reduce pain and swelling, place one to two pillows under the knee the first two or three days when sitting or lying. An ice pack may be placed on top of the area over the dressing. Instructions for making homemade icepack are as follow:  °Flexible homemade alcohol water ice pack  °2 cups water  °1 cup rubbing alcohol  °food coloring for the blue tint (optional)  °2 zip-top bags - gallon-size  °Mix the water and alcohol together in one of your zip-top bags and add food coloring. Release as much air as possible and seal the bag. Place in freezer for at least 12 hours.  °The small incisions in your knee are closed with nylon stitches. They will be removed in the office.  °The bulky dressing may be removed in the third day after surgery. (If ACL surgery-DO NOT REMOVE BANDAGES). Put a waterproof band-aid over each stitch. Do not put any creams or ointments on wounds. You may shower at this time, but change waterproof band-aids after showering. KEEP INCISIONS CLEAN AND DRY UNTIL YOU RETURN TO   THE OFFICE.  °Sometimes the operative area remains somewhat painful and swollen for several weeks. This is usually nothing to worry about, but call if you have any excessive symptoms, especially fever. It is not unusual to have a low grade fever of 99 degrees for the first few days. If persist after 3-4 days call the office. It is not uncommon for the pain to be a little worse on  the third day after surgery.  °Begin doing gentle exercises right away. They will be limited by the amount of pain and swelling you have.  Exercising will reduce the swelling, increase motion, and prevent muscle weakness. Exercises: Straight leg raising and gentle knee bending.  °Take 81 milligram aspirin twice a day for 2 weeks after meals or milk. This along with elevation will help reduce the possibility of phlebitis in your operated leg.  °Avoid strenuous athletics for a minimum of 4 to 6 weeks after arthroscopic surgery (approximately five months if ACL surgery).  °If the surgery included ACL reconstruction the brace that is supplied to the extremity post surgery is to be locked in extension when you are asleep and is to be locked in extension when you are ambulating. It can be unlocked for exercises or sitting.  °Keep your post surgery appointment that has been made for you. If you do not remember the date call [phone]. Your follow up appointment should be between 7-10 days.  ° °

## 2014-09-27 NOTE — Anesthesia Preprocedure Evaluation (Signed)
Anesthesia Evaluation  Patient identified by MRN, date of birth, ID band  Reviewed: NPO status   History of Anesthesia Complications Negative for: history of anesthetic complications  Airway Mallampati: II  TM Distance: >3 FB Neck ROM: full    Dental no notable dental hx.    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     Plan Discussed with: CRNA  Anesthesia Plan Comments:         Anesthesia Quick Evaluation

## 2014-09-27 NOTE — Anesthesia Postprocedure Evaluation (Signed)
  Anesthesia Post-op Note  Patient: Curtis Krueger  Procedure(s) Performed: Procedure(s) with comments: ARTHROSCOPY KNEE DEBRIDEMENT (Left) - Diabetic - oral meds  Anesthesia type:General  Patient location: PACU  Post pain: Pain level controlled  Post assessment: Post-op Vital signs reviewed, Patient's Cardiovascular Status Stable, Respiratory Function Stable, Patent Airway and No signs of Nausea or vomiting  Post vital signs: Reviewed and stable  Last Vitals:  Filed Vitals:   09/27/14 0845  BP: 160/99  Pulse: 93  Temp:   Resp: 19    Level of consciousness: awake, alert  and patient cooperative  Complications: No apparent anesthesia complications  Pt to take it HTN meds when he gets home.

## 2014-09-27 NOTE — Transfer of Care (Signed)
Immediate Anesthesia Transfer of Care Note  Patient: Curtis Krueger  Procedure(s) Performed: Procedure(s) with comments: ARTHROSCOPY KNEE DEBRIDEMENT (Left) - Diabetic - oral meds  Patient Location: PACU  Anesthesia Type: General  Level of Consciousness: awake, alert  and patient cooperative  Airway and Oxygen Therapy: Patient Spontanous Breathing and Patient connected to supplemental oxygen  Post-op Assessment: Post-op Vital signs reviewed, Patient's Cardiovascular Status Stable, Respiratory Function Stable, Patent Airway and No signs of Nausea or vomiting  Post-op Vital Signs: Reviewed and stable  Complications: No apparent anesthesia complications

## 2014-09-27 NOTE — H&P (Signed)
  H and P reviewed. No changes. Uploaded at later date. 

## 2014-09-30 ENCOUNTER — Encounter: Payer: Self-pay | Admitting: Unknown Physician Specialty

## 2016-01-09 ENCOUNTER — Ambulatory Visit: Payer: Self-pay | Admitting: Urology

## 2017-02-16 IMAGING — MR MR KNEE*L* W/O CM
5 series · 38 of 40 positions shown · non-contrast
Comparison: None.

CLINICAL DATA: Medial lateral knee pain for 1 year

EXAM:
MRI OF THE LEFT KNEE WITHOUT CONTRAST
TECHNIQUE: Multiplanar, multisequence MR imaging of the knee was performed. No
intravenous contrast was administered.

[Series 3: PD fat-sat · axial · 3.0mm · 0.31mm/px · z∈[-53,+49]mm · 8 of 32 slices shown (1 of 3)]
[im 1/32]
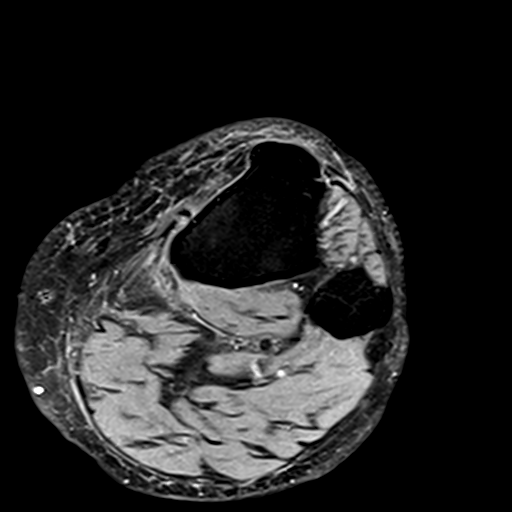
[im 5/32]
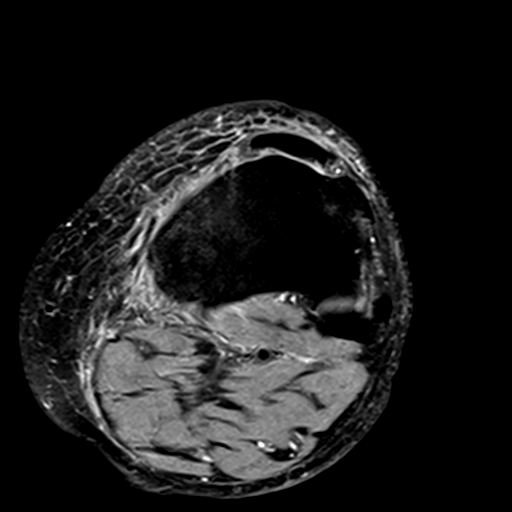
[im 9/32]
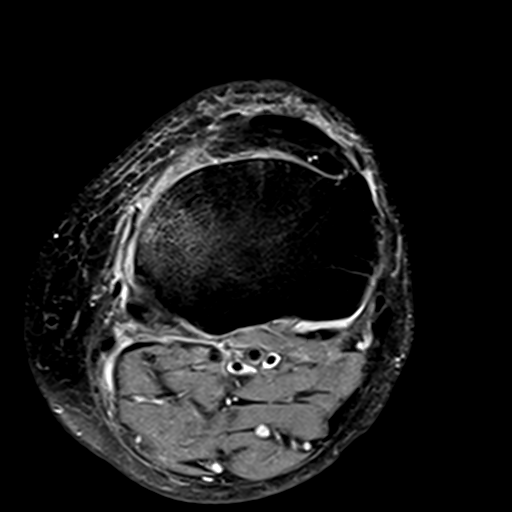
[im 14/32]
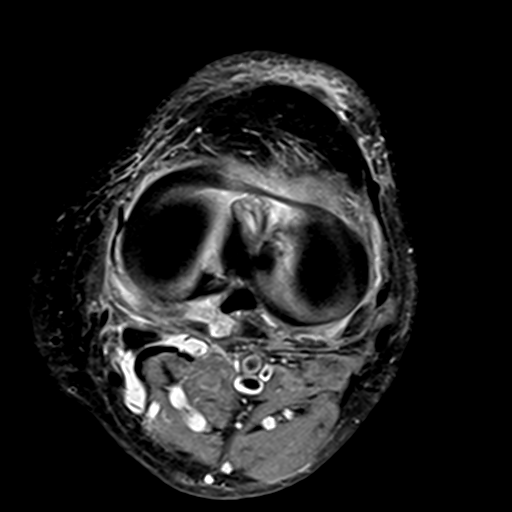
[im 18/32]
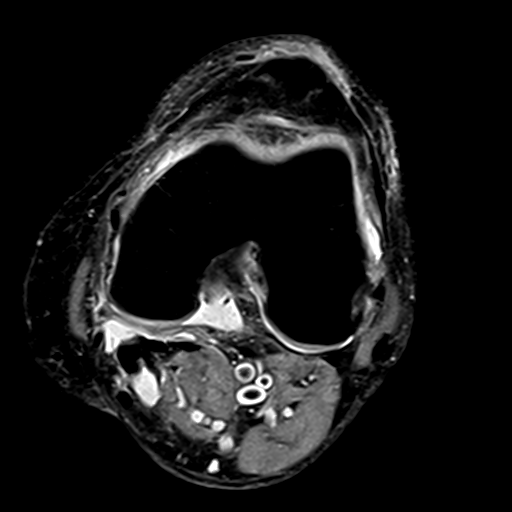
[im 23/32]
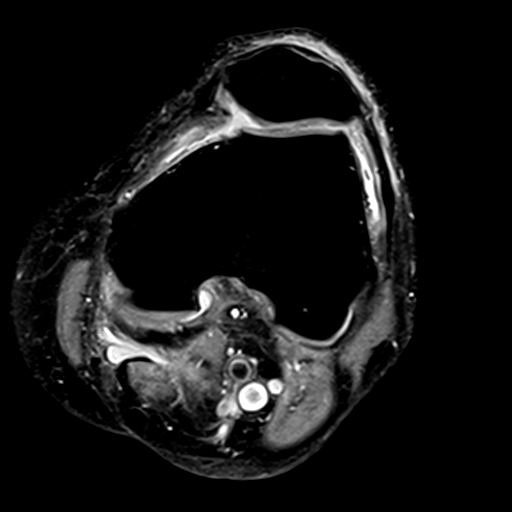
[im 27/32]
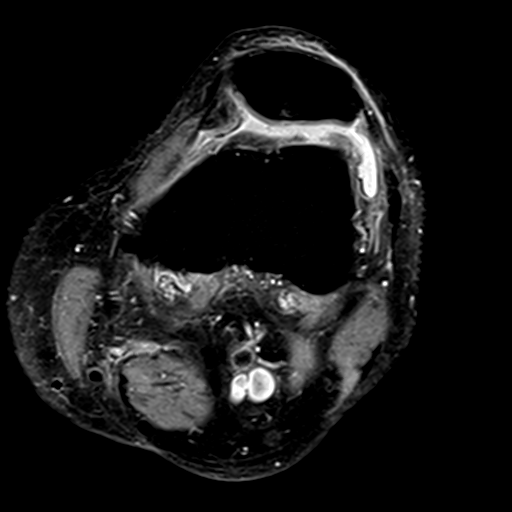
[im 32/32]
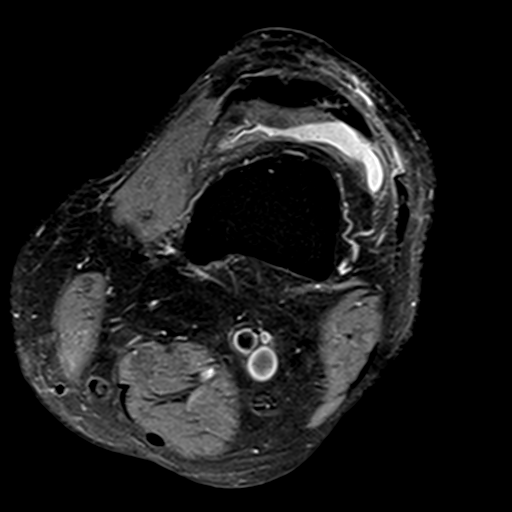

[Series 4: T1 · coronal · 3.0mm · 0.50mm/px · 6 of 33 slices shown]
[im 1/33]
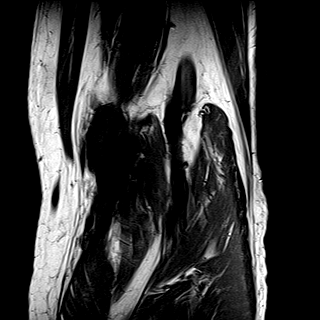
[im 5/33]
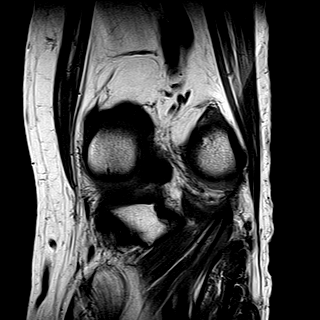
[im 10/33]
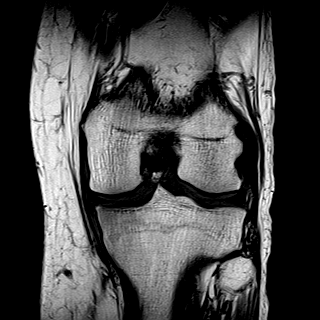
[im 14/33]
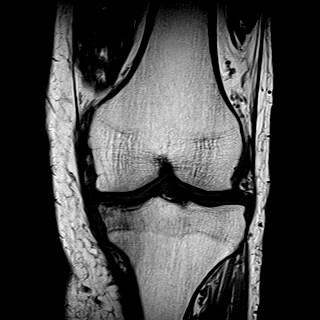
[im 19/33]
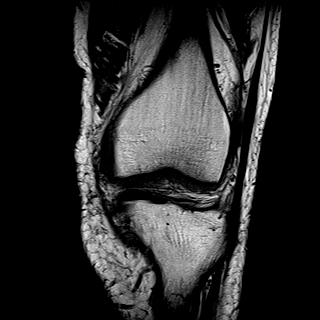
[im 23/33]
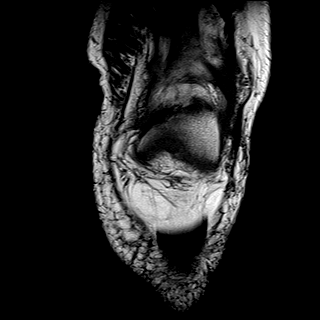

[Series 5: PD fat-sat · sagittal · 3.0mm · 0.62mm/px · 8 of 32 slices shown (2 of 3)]
[im 1/32]
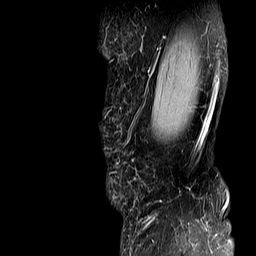
[im 5/32]
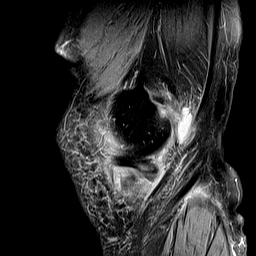
[im 9/32]
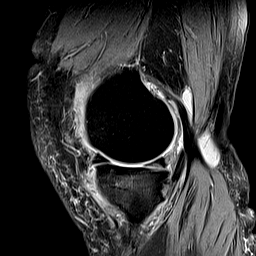
[im 14/32]
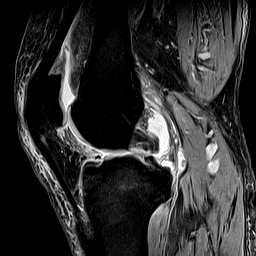
[im 18/32]
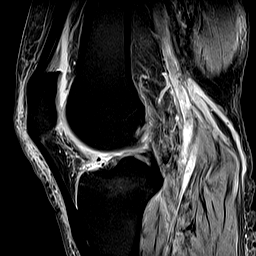
[im 23/32]
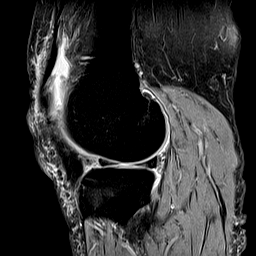
[im 27/32]
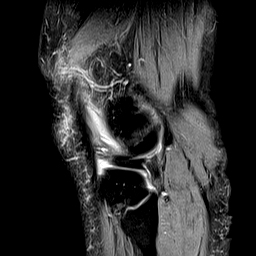
[im 32/32]
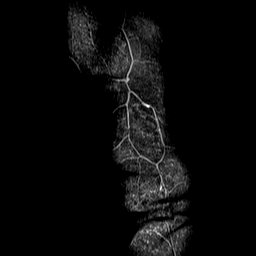

[Series 6: T2 fat-sat · coronal · 3.0mm · 0.50mm/px · 8 of 33 slices shown]
[im 1/33]
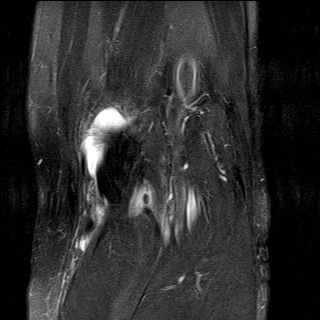
[im 5/33]
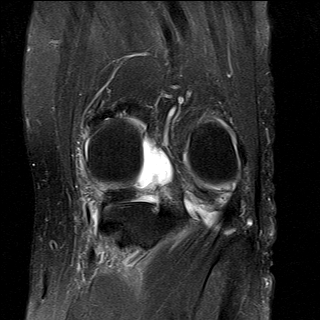
[im 10/33]
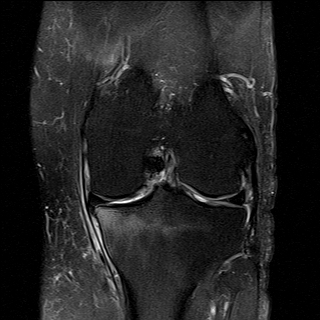
[im 14/33]
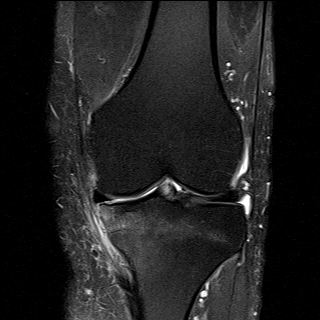
[im 19/33]
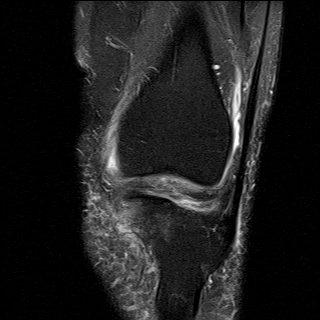
[im 23/33]
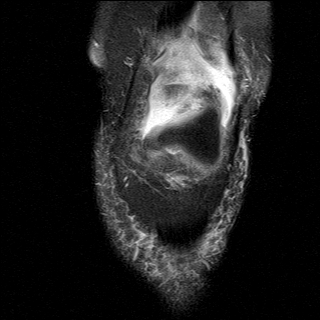
[im 28/33]
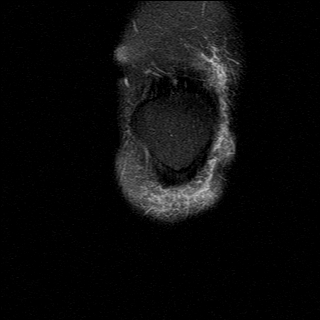
[im 33/33]
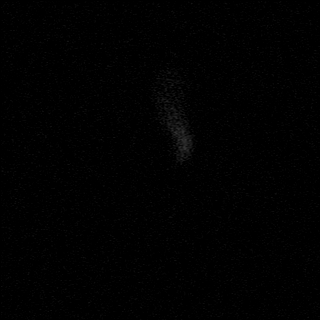

[Series 7: PD fat-sat · coronal · 3.0mm · 0.62mm/px · 8 of 33 slices shown (3 of 3)]
[im 1/33]
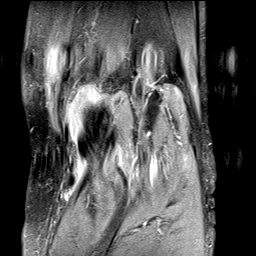
[im 5/33]
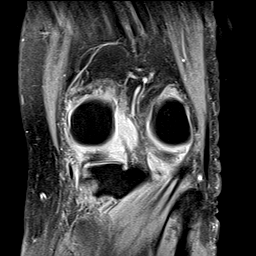
[im 10/33]
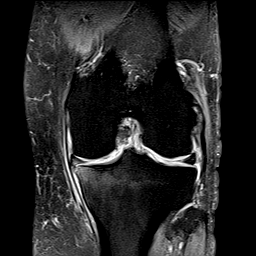
[im 14/33]
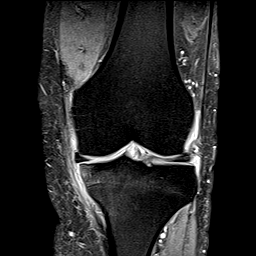
[im 19/33]
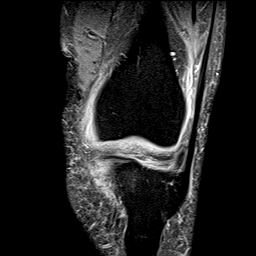
[im 23/33]
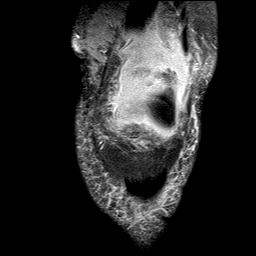
[im 28/33]
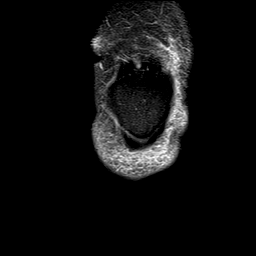
[im 33/33]
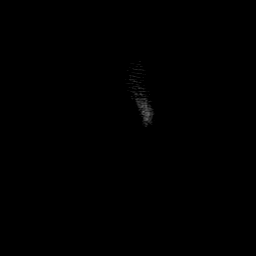

[38 of 40 positions shown; findings below may reference images not displayed]

FINDINGS: MENISCI

Medial meniscus: Oblique tear of the posterior horn of the medial
meniscus extending to the inferior articular surface.

Lateral meniscus: Mild complex tear of the posterior horn of the
lateral meniscus involving the superior inferior articular surfaces.

LIGAMENTS

Cruciates: Intact ACL and PCL. Mild increased signal within the
substance of the ACL as can be seen with mucinous degeneration.

Collaterals: Medial collateral ligament is intact. Lateral
collateral ligament complex is intact.

CARTILAGE

Patellofemoral: Partial thickness cartilage loss of the
patellofemoral compartment.

Medial: High-grade partial-thickness cartilage loss of the medial
femorotibial compartment with areas of full-thickness cartilage loss
involving the medial tibial plateau with subchondral reactive marrow
changes in the medial tibial plateau.

Lateral: Partial thickness cartilage loss of the lateral
femorotibial compartment with a more focal area of high-grade
partial-thickness cartilage loss involving the medial aspect of the
lateral tibial plateau.

Joint: Small joint effusion. No plical thickening. Mild edema in
Hoffa's fat.

Popliteal Fossa:  Small Baker cyst.  Intact popliteus tendon.

Extensor Mechanism:  Intact.

Bones: No other marrow signal abnormality. No fracture or
dislocation.
IMPRESSION: 1. Tricompartmental cartilage abnormalities as described above.
2. Oblique tear of the posterior horn of the medial meniscus
extending to the inferior articular surface.
3. Mild complex tear of the posterior horn of the lateral meniscus
involving the superior inferior articular surfaces.

## 2020-05-23 ENCOUNTER — Other Ambulatory Visit: Admission: RE | Admit: 2020-05-23 | Payer: BLUE CROSS/BLUE SHIELD | Source: Ambulatory Visit

## 2020-08-11 ENCOUNTER — Encounter: Payer: Self-pay | Admitting: *Deleted

## 2020-08-12 ENCOUNTER — Encounter: Admission: RE | Payer: Self-pay | Source: Home / Self Care

## 2020-08-12 ENCOUNTER — Ambulatory Visit: Admission: RE | Admit: 2020-08-12 | Payer: Medicare Other | Source: Home / Self Care

## 2020-08-12 SURGERY — COLONOSCOPY WITH PROPOFOL
Anesthesia: General

## 2020-09-23 ENCOUNTER — Encounter: Payer: Self-pay | Admitting: General Surgery

## 2020-09-24 ENCOUNTER — Ambulatory Visit
Admission: RE | Admit: 2020-09-24 | Discharge: 2020-09-24 | Disposition: A | Discharge status: Home / Self Care | Payer: Medicare Other | Attending: General Surgery | Admitting: General Surgery

## 2020-09-24 ENCOUNTER — Ambulatory Visit: Payer: Medicare Other | Admitting: Anesthesiology

## 2020-09-24 ENCOUNTER — Encounter: Payer: Self-pay | Admitting: General Surgery

## 2020-09-24 ENCOUNTER — Encounter
Admission: RE | Disposition: A | Discharge status: Home / Self Care | Payer: Self-pay | Source: Home / Self Care | Attending: General Surgery

## 2020-09-24 DIAGNOSIS — K2289 Other specified disease of esophagus: Secondary | ICD-10-CM | POA: Insufficient documentation

## 2020-09-24 DIAGNOSIS — Z7984 Long term (current) use of oral hypoglycemic drugs: Secondary | ICD-10-CM | POA: Diagnosis not present

## 2020-09-24 DIAGNOSIS — K5730 Diverticulosis of large intestine without perforation or abscess without bleeding: Secondary | ICD-10-CM | POA: Diagnosis not present

## 2020-09-24 DIAGNOSIS — Z79899 Other long term (current) drug therapy: Secondary | ICD-10-CM | POA: Diagnosis not present

## 2020-09-24 DIAGNOSIS — K2100 Gastro-esophageal reflux disease with esophagitis, without bleeding: Secondary | ICD-10-CM | POA: Insufficient documentation

## 2020-09-24 DIAGNOSIS — K2950 Unspecified chronic gastritis without bleeding: Secondary | ICD-10-CM | POA: Insufficient documentation

## 2020-09-24 DIAGNOSIS — D509 Iron deficiency anemia, unspecified: Secondary | ICD-10-CM | POA: Diagnosis not present

## 2020-09-24 DIAGNOSIS — Z7982 Long term (current) use of aspirin: Secondary | ICD-10-CM | POA: Diagnosis not present

## 2020-09-24 DIAGNOSIS — K449 Diaphragmatic hernia without obstruction or gangrene: Secondary | ICD-10-CM | POA: Insufficient documentation

## 2020-09-24 DIAGNOSIS — Z87891 Personal history of nicotine dependence: Secondary | ICD-10-CM | POA: Insufficient documentation

## 2020-09-24 HISTORY — PX: COLONOSCOPY WITH PROPOFOL: SHX5780

## 2020-09-24 HISTORY — DX: Male erectile dysfunction, unspecified: N52.9

## 2020-09-24 HISTORY — DX: Other seasonal allergic rhinitis: J30.2

## 2020-09-24 HISTORY — PX: ESOPHAGOGASTRODUODENOSCOPY (EGD) WITH PROPOFOL: SHX5813

## 2020-09-24 LAB — GLUCOSE, CAPILLARY: Glucose-Capillary: 97 mg/dL (ref 70–99)

## 2020-09-24 SURGERY — ESOPHAGOGASTRODUODENOSCOPY (EGD) WITH PROPOFOL
Anesthesia: General

## 2020-09-24 MED ORDER — LIDOCAINE HCL (PF) 2 % IJ SOLN
INTRAMUSCULAR | Status: AC
  Filled 2020-09-24: qty 5

## 2020-09-24 MED ORDER — PHENYLEPHRINE HCL (PRESSORS) 10 MG/ML IV SOLN
INTRAVENOUS | Status: AC
  Filled 2020-09-24: qty 1

## 2020-09-24 MED ORDER — PROPOFOL 10 MG/ML IV BOLUS
INTRAVENOUS | Status: AC
  Filled 2020-09-24: qty 20

## 2020-09-24 MED ORDER — PROPOFOL 500 MG/50ML IV EMUL
INTRAVENOUS | Status: AC
  Filled 2020-09-24: qty 50

## 2020-09-24 MED ORDER — PROPOFOL 500 MG/50ML IV EMUL
INTRAVENOUS | Status: DC | PRN
  Administered 2020-09-24: 120 ug/kg/min via INTRAVENOUS

## 2020-09-24 MED ORDER — LIDOCAINE HCL (CARDIAC) PF 100 MG/5ML IV SOSY
PREFILLED_SYRINGE | INTRAVENOUS | Status: DC | PRN
  Administered 2020-09-24: 50 mg via INTRAVENOUS

## 2020-09-24 MED ORDER — SODIUM CHLORIDE 0.9 % IV SOLN: INTRAVENOUS | Status: DC

## 2020-09-24 NOTE — Anesthesia Procedure Notes (Signed)
Procedure Name: MAC Date/Time: 09/24/2020 8:50 AM Performed by: Vaughan Sine Pre-anesthesia Checklist: Patient identified, Emergency Drugs available, Suction available, Patient being monitored and Timeout performed Patient Re-evaluated:Patient Re-evaluated prior to induction Oxygen Delivery Method: Simple face mask Preoxygenation: Pre-oxygenation with 100% oxygen Induction Type: IV induction Airway Equipment and Method: Bite block Placement Confirmation: positive ETCO2 and CO2 detector

## 2020-09-24 NOTE — Op Note (Signed)
[facility] Gastroenterology Patient Name: Curtis Krueger Procedure Date: 09/24/2020 8:16 AM MRN: [id number] Account #: [id number] Date of Birth: 06-20-1940 Admit Type: Outpatient Age: 80 Room: [facility] ENDO ROOM 1 Gender: Male Note Status: Finalized Instrument Name: Peds Colonoscope [id number] Procedure:             Colonoscopy Indications:           Unexplained iron deficiency anemia Providers:             Robert Bellow, MD Referring MD:          Wynona Canes. Kym Groom, MD (Referring MD) Medicines:             Propofol per Anesthesia Complications:         No immediate complications. Procedure:             Pre-Anesthesia Assessment:                        - Prior to the procedure, a History and Physical was                         performed, and patient medications, allergies and                         sensitivities were reviewed. The patient's tolerance                         of previous anesthesia was reviewed.                        - The risks and benefits of the procedure and the                         sedation options and risks were discussed with the                         patient. All questions were answered and informed                         consent was obtained.                        After obtaining informed consent, the colonoscope was                         passed under direct vision. Throughout the procedure,                         the patient's blood pressure, pulse, and oxygen                         saturations were monitored continuously. The                         Colonoscope was introduced through the anus and                         advanced to the the cecum, identified by appendiceal  orifice and ileocecal valve. The colonoscopy was                         somewhat difficult due to multiple diverticula in the                         colon and significant looping. Successful completion                         of the  procedure was aided by using manual pressure.                         The patient tolerated the procedure well. The quality                         of the bowel preparation was excellent. Findings:      Multiple small and large-mouthed diverticula were found in the sigmoid       colon and descending colon.      The retroflexed view of the distal rectum and anal verge was normal and       showed no anal or rectal abnormalities. Impression:            - Diverticulosis in the sigmoid colon and in the                         descending colon.                        - The distal rectum and anal verge are normal on                         retroflexion view.                        - No specimens collected. Recommendation:        - Telephone endoscopist for pathology results in 1                         week. Procedure Code(s):     --- Professional ---                        [phone], Colonoscopy, flexible; diagnostic, including                         collection of specimen(s) by brushing or washing, when                         performed (separate procedure) Diagnosis Code(s):     --- Professional ---                        D50.9, Iron deficiency anemia, unspecified                        K57.30, Diverticulosis of large intestine without                         perforation or abscess without bleeding CPT copyright 2019 American Medical Association. All rights reserved. The codes documented in this  report are preliminary and upon coder review may  be revised to meet current compliance requirements. Robert Bellow, MD 09/24/2020 9:16:19 AM This report has been signed electronically. Number of Addenda: 0 Note Initiated On: 09/24/2020 8:16 AM Scope Withdrawal Time: 0 hours 10 minutes 2 seconds  Total Procedure Duration: 0 hours 22 minutes 1 second  Estimated Blood Loss:  Estimated blood loss: none.      [facility]

## 2020-09-24 NOTE — Anesthesia Postprocedure Evaluation (Signed)
Anesthesia Post Note  Patient: Curtis Krueger  Procedure(s) Performed: ESOPHAGOGASTRODUODENOSCOPY (EGD) WITH PROPOFOL COLONOSCOPY WITH PROPOFOL  Patient location during evaluation: Endoscopy Anesthesia Type: General Level of consciousness: awake and alert Pain management: pain level controlled Vital Signs Assessment: post-procedure vital signs reviewed and stable Respiratory status: spontaneous breathing, nonlabored ventilation, respiratory function stable and patient connected to nasal cannula oxygen Cardiovascular status: blood pressure returned to baseline and stable Postop Assessment: no apparent nausea or vomiting Anesthetic complications: no   No notable events documented.   Last Vitals:  Vitals:   09/24/20 0918 09/24/20 0938  BP: 123/62 130/68  Pulse: 86   Resp: 13   Temp: 36.4 C   SpO2: 97%     Last Pain:  Vitals:   09/24/20 0938  TempSrc:   PainSc: 0-No pain                 Precious Haws Charlean Carneal

## 2020-09-24 NOTE — H&P (Signed)
Curtis Krueger [id number] October 19, 1940     HPI:  80 y.o with anemia and chronic cough.  For EGD/ colonoscopy.   Medications Prior to Admission  Medication Sig Dispense Refill Last Dose   aspirin EC 81 MG tablet Take 81 mg by mouth daily. Swallow whole.   09/23/2020   clobetasol cream (TEMOVATE) [id number] % Apply 1 application topically 2 (two) times daily.      fluticasone (FLONASE) 50 MCG/ACT nasal spray Place 2 sprays into both nostrils daily.      folic acid (FOLVITE) 1 MG tablet Take 1 mg by mouth daily.      hydrochlorothiazide (HYDRODIURIL) 25 MG tablet Take 25 mg by mouth daily.      hydrOXYzine (ATARAX/VISTARIL) 25 MG tablet Take 25 mg by mouth every 6 (six) hours as needed.      levocetirizine (XYZAL) 5 MG tablet Take 5 mg by mouth every evening.      losartan (COZAAR) 100 MG tablet Take 100 mg by mouth daily.      metFORMIN (GLUCOPHAGE) 500 MG tablet Take 850 mg by mouth 2 (two) times daily with a meal.       Multiple Vitamins-Minerals (PRESERVISION AREDS 2+MULTI VIT PO) Take 1 tablet by mouth 2 (two) times daily with a meal.      naproxen sodium (ANAPROX) 220 MG tablet Take 220 mg by mouth as needed.      Potassium 99 MG TABS Take 1 tablet by mouth daily.      rosuvastatin (CRESTOR) 40 MG tablet Take 40 mg by mouth daily.      terbinafine (LAMISIL) 250 MG tablet Take 250 mg by mouth daily.      triamcinolone cream (KENALOG) 0.1 % Apply 1 application topically 2 (two) times daily.      No Known Allergies Past Medical History:  Diagnosis Date   Arthritis    hands, shoulder, knees   Diabetes mellitus without complication [facility])    ED (erectile dysfunction)    Hyperlipidemia    Hypertension    Seasonal allergies    Past Surgical History:  Procedure Laterality Date   APPENDECTOMY     CATARACT EXTRACTION     EUS  12/02/2011   Procedure: LOWER ENDOSCOPIC ULTRASOUND (EUS);  Surgeon: Milus Banister, MD;  Location: [facility];  Service: Endoscopy;  Laterality: N/A;   HAND  SURGERY     right hand, nodule removed on finger    KNEE ARTHROSCOPY Left 09/27/2014   Procedure: ARTHROSCOPY KNEE, chondral debridement with coblation and medial menisectomy.;  Surgeon: Leanor Kail, MD;  Location: [facility];  Service: Orthopedics;  Laterality: Left;  Diabetic - oral meds   TONSILLECTOMY     VASECTOMY     Social History   Socioeconomic History   Marital status: Married    Spouse name: Not on file   Number of children: Not on file   Years of education: Not on file   Highest education level: Not on file  Occupational History   Not on file  Tobacco Use   Smoking status: Former    Types: Cigarettes    Quit date: 12/13/1978    Years since quitting: 41.8   Smokeless tobacco: Never  Vaping Use   Vaping Use: Never used  Substance and Sexual Activity   Alcohol use: Yes    Alcohol/week: 8.0 - 9.0 standard drinks    Types: 7 Glasses of wine, 1 - 2 Standard drinks or equivalent per week    Comment: every  day   Drug use: No   Sexual activity: Not on file  Other Topics Concern   Not on file  Social History Narrative   Not on file   Social Determinants of Health   Financial Resource Strain: Not on file  Food Insecurity: Not on file  Transportation Needs: Not on file  Physical Activity: Not on file  Stress: Not on file  Social Connections: Not on file  Intimate Partner Violence: Not on file   Social History   Social History Narrative   Not on file     ROS: Negative.     PE: HEENT: Negative. Lungs: Clear. Cardio: RR.   Assessment/Plan:  Proceed with planned endoscopy.  Forest Gleason [facility] 09/24/2020

## 2020-09-24 NOTE — Transfer of Care (Signed)
Immediate Anesthesia Transfer of Care Note  Patient: Curtis Krueger  Procedure(s) Performed: ESOPHAGOGASTRODUODENOSCOPY (EGD) WITH PROPOFOL COLONOSCOPY WITH PROPOFOL  Patient Location: PACU  Anesthesia Type:General  Level of Consciousness: awake and sedated  Airway & Oxygen Therapy: Patient Spontanous Breathing and Patient connected to nasal cannula oxygen  Post-op Assessment: Report given to RN and Post -op Vital signs reviewed and stable  Post vital signs: Reviewed and stable  Last Vitals:  Vitals Value Taken Time  BP    Temp    Pulse    Resp    SpO2      Last Pain:  Vitals:   09/24/20 0803  TempSrc: Temporal  PainSc: 5          Complications: No notable events documented.

## 2020-09-24 NOTE — Op Note (Signed)
[facility] Gastroenterology Patient Name: Curtis Krueger Procedure Date: 09/24/2020 8:25 AM MRN: [id number] Account #: [id number] Date of Birth: 1940-09-21 Admit Type: Outpatient Age: 80 Room: [facility] ENDO ROOM 1 Gender: Male Note Status: Finalized Instrument Name: Upper Endoscope [id number] Procedure:             Upper GI endoscopy Indications:           Unexplained iron deficiency anemia Providers:             Robert Bellow, MD Referring MD:          Wynona Canes. Kym Groom, MD (Referring MD) Medicines:             Propofol per Anesthesia Complications:         No immediate complications. Procedure:             Pre-Anesthesia Assessment:                        - Prior to the procedure, a History and Physical was                         performed, and patient medications, allergies and                         sensitivities were reviewed. The patient's tolerance                         of previous anesthesia was reviewed.                        - The risks and benefits of the procedure and the                         sedation options and risks were discussed with the                         patient. All questions were answered and informed                         consent was obtained.                        After obtaining informed consent, the endoscope was                         passed under direct vision. Throughout the procedure,                         the patient's blood pressure, pulse, and oxygen                         saturations were monitored continuously. The Endoscope                         was introduced through the mouth, and advanced to the                         second part of duodenum. The upper GI endoscopy was  accomplished without difficulty. The patient tolerated                         the procedure well. Findings:      Localized mild mucosal variance was found in the lower third of the       esophagus. Biopsies were  taken with a cold forceps for histology.      A small hiatal hernia was present.      Patchy mild inflammation was found in the prepyloric region of the       stomach.      The examined duodenum was normal. Impression:            - Esophageal mucosal variant. Biopsied.                        - Small hiatal hernia.                        - Chronic gastritis.                        - Normal examined duodenum. Recommendation:        - Perform a colonoscopy today. Procedure Code(s):     --- Professional ---                        [phone], Esophagogastroduodenoscopy, flexible,                         transoral; with biopsy, single or multiple Diagnosis Code(s):     --- Professional ---                        K22.8, Other specified diseases of esophagus                        K44.9, Diaphragmatic hernia without obstruction or                         gangrene                        K29.50, Unspecified chronic gastritis without bleeding                        D50.9, Iron deficiency anemia, unspecified CPT copyright 2019 American Medical Association. All rights reserved. The codes documented in this report are preliminary and upon coder review may  be revised to meet current compliance requirements. Robert Bellow, MD 09/24/2020 8:50:49 AM This report has been signed electronically. Number of Addenda: 0 Note Initiated On: 09/24/2020 8:25 AM Estimated Blood Loss:  Estimated blood loss was minimal.      [facility]

## 2020-09-24 NOTE — Anesthesia Preprocedure Evaluation (Signed)
Anesthesia Evaluation  Patient identified by MRN, date of birth, ID band Patient awake    Reviewed: Allergy & Precautions, NPO status , Patient's Chart, lab work & pertinent test results  Airway Mallampati: III  TM Distance: >3 FB Neck ROM: full    Dental  (+) Chipped   Pulmonary neg shortness of breath, former smoker,    Pulmonary exam normal        Cardiovascular Exercise Tolerance: Good hypertension, Normal cardiovascular exam+ dysrhythmias      Neuro/Psych negative neurological ROS  negative psych ROS   GI/Hepatic negative GI ROS, Neg liver ROS,   Endo/Other  diabetes, Type 2  Renal/GU negative Renal ROS  negative genitourinary   Musculoskeletal   Abdominal   Peds  Hematology negative hematology ROS (+)   Anesthesia Other Findings Past Medical History: No date: Arthritis     Comment:  hands, shoulder, knees No date: Diabetes mellitus without complication (HCC) No date: ED (erectile dysfunction) No date: Hyperlipidemia No date: Hypertension No date: Seasonal allergies  Past Surgical History: No date: APPENDECTOMY No date: CATARACT EXTRACTION 12/02/2011: EUS     Comment:  Procedure: LOWER ENDOSCOPIC ULTRASOUND (EUS);  Surgeon:               Milus Banister, MD;  Location: [facility];  Service:               Endoscopy;  Laterality: N/A; No date: HAND SURGERY     Comment:  right hand, nodule removed on finger  09/27/2014: KNEE ARTHROSCOPY; Left     Comment:  Procedure: ARTHROSCOPY KNEE, chondral debridement with               coblation and medial menisectomy.;  Surgeon: Leanor Kail, MD;  Location: [facility];  Service:               Orthopedics;  Laterality: Left;  Diabetic - oral meds No date: TONSILLECTOMY No date: VASECTOMY  BMI    Body Mass Index: 28.19 kg/m      Reproductive/Obstetrics negative OB ROS                              Anesthesia Physical Anesthesia Plan  ASA: 3  Anesthesia Plan: General   Post-op Pain Management:    Induction: Intravenous  PONV Risk Score and Plan: Propofol infusion and TIVA  Airway Management Planned: Natural Airway and Nasal Cannula  Additional Equipment:   Intra-op Plan:   Post-operative Plan:   Informed Consent: I have reviewed the patients History and Physical, chart, labs and discussed the procedure including the risks, benefits and alternatives for the proposed anesthesia with the patient or authorized representative who has indicated his/her understanding and acceptance.     Dental Advisory Given  Plan Discussed with: Anesthesiologist, CRNA and Surgeon  Anesthesia Plan Comments: (Patient consented for risks of anesthesia including but not limited to:  - adverse reactions to medications - risk of airway placement if required - damage to eyes, teeth, lips or other oral mucosa - nerve damage due to positioning  - sore throat or hoarseness - Damage to heart, brain, nerves, lungs, other parts of body or loss of life  Patient voiced understanding.)        Anesthesia Quick Evaluation

## 2020-09-25 ENCOUNTER — Encounter: Payer: Self-pay | Admitting: General Surgery

## 2020-09-25 LAB — SURGICAL PATHOLOGY

## 2022-08-13 ENCOUNTER — Other Ambulatory Visit: Payer: Self-pay | Admitting: Family Medicine

## 2022-08-13 DIAGNOSIS — R2242 Localized swelling, mass and lump, left lower limb: Secondary | ICD-10-CM

## 2022-08-16 ENCOUNTER — Ambulatory Visit
Admission: RE | Admit: 2022-08-16 | Discharge: 2022-08-16 | Disposition: A | Discharge status: Home / Self Care | Payer: Medicare Other | Source: Ambulatory Visit | Attending: Family Medicine | Admitting: Family Medicine

## 2022-08-16 DIAGNOSIS — R2242 Localized swelling, mass and lump, left lower limb: Secondary | ICD-10-CM

## 2023-12-02 ENCOUNTER — Other Ambulatory Visit: Payer: Self-pay | Admitting: Gastroenterology

## 2023-12-02 DIAGNOSIS — R09A2 Foreign body sensation, throat: Secondary | ICD-10-CM

## 2023-12-05 ENCOUNTER — Ambulatory Visit
Admission: RE | Admit: 2023-12-05 | Discharge: 2023-12-05 | Disposition: A | Discharge status: Home / Self Care | Source: Ambulatory Visit | Attending: Gastroenterology | Admitting: Gastroenterology

## 2023-12-05 DIAGNOSIS — R09A2 Foreign body sensation, throat: Secondary | ICD-10-CM | POA: Diagnosis present

## 2024-01-09 ENCOUNTER — Ambulatory Visit
Admission: RE | Admit: 2024-01-09 | Discharge: 2024-01-09 | Disposition: A | Discharge status: Home / Self Care | Attending: Gastroenterology | Admitting: Gastroenterology

## 2024-01-09 ENCOUNTER — Ambulatory Visit: Admitting: Anesthesiology

## 2024-01-09 ENCOUNTER — Encounter: Admission: RE | Payer: Self-pay | Source: Home / Self Care

## 2024-01-09 ENCOUNTER — Other Ambulatory Visit: Payer: Self-pay

## 2024-01-09 DIAGNOSIS — Z87891 Personal history of nicotine dependence: Secondary | ICD-10-CM | POA: Insufficient documentation

## 2024-01-09 DIAGNOSIS — I10 Essential (primary) hypertension: Secondary | ICD-10-CM | POA: Diagnosis not present

## 2024-01-09 DIAGNOSIS — Z800 Family history of malignant neoplasm of digestive organs: Secondary | ICD-10-CM | POA: Diagnosis not present

## 2024-01-09 DIAGNOSIS — K222 Esophageal obstruction: Secondary | ICD-10-CM | POA: Insufficient documentation

## 2024-01-09 DIAGNOSIS — Z7984 Long term (current) use of oral hypoglycemic drugs: Secondary | ICD-10-CM | POA: Diagnosis not present

## 2024-01-09 DIAGNOSIS — Z6827 Body mass index (BMI) 27.0-27.9, adult: Secondary | ICD-10-CM | POA: Diagnosis not present

## 2024-01-09 DIAGNOSIS — E119 Type 2 diabetes mellitus without complications: Secondary | ICD-10-CM | POA: Insufficient documentation

## 2024-01-09 DIAGNOSIS — Z7982 Long term (current) use of aspirin: Secondary | ICD-10-CM | POA: Insufficient documentation

## 2024-01-09 DIAGNOSIS — Z79899 Other long term (current) drug therapy: Secondary | ICD-10-CM | POA: Insufficient documentation

## 2024-01-09 DIAGNOSIS — F458 Other somatoform disorders: Secondary | ICD-10-CM | POA: Diagnosis present

## 2024-01-09 DIAGNOSIS — E66813 Obesity, class 3: Secondary | ICD-10-CM | POA: Diagnosis not present

## 2024-01-09 DIAGNOSIS — K449 Diaphragmatic hernia without obstruction or gangrene: Secondary | ICD-10-CM | POA: Diagnosis not present

## 2024-01-09 HISTORY — PX: ESOPHAGOGASTRODUODENOSCOPY: SHX5428

## 2024-01-09 HISTORY — PX: ESOPHAGEAL DILATION: SHX303

## 2024-01-09 LAB — GLUCOSE, CAPILLARY: Glucose-Capillary: 105 mg/dL — ABNORMAL HIGH (ref 70–99)

## 2024-01-09 SURGERY — EGD (ESOPHAGOGASTRODUODENOSCOPY)
Anesthesia: General

## 2024-01-09 MED ORDER — LIDOCAINE HCL (CARDIAC) PF 100 MG/5ML IV SOSY
PREFILLED_SYRINGE | INTRAVENOUS | Status: DC | PRN
  Administered 2024-01-09 (×2): 50 mg via INTRAVENOUS

## 2024-01-09 MED ORDER — SODIUM CHLORIDE 0.9 % IV SOLN: INTRAVENOUS | Status: DC

## 2024-01-09 MED ORDER — PROPOFOL 10 MG/ML IV BOLUS
INTRAVENOUS | Status: DC | PRN
  Administered 2024-01-09: 70 mg via INTRAVENOUS
  Administered 2024-01-09 (×2): 30 mg via INTRAVENOUS

## 2024-01-09 NOTE — Op Note (Signed)
 [facility] Department Gastroenterology Patient Name: Curtis Krueger Procedure Date: 01/09/2024 9:46 AM MRN: [id number] Account #: [id number] Date of Birth: 03-26-1940 Admit Type: Outpatient Age: 83 Room: [facility] ENDO ROOM 3 Gender: Male Note Status: Finalized Instrument Name: Upper GI Scope [id number] Procedure:             Upper GI endoscopy Indications:           Abnormal cine-esophagram, Globus sensation Providers:             Ole Schick MD, MD Referring MD:          Ole Schick MD, MD (Referring MD), Bette BRAVO.                         Eliverto, MD (Referring MD) Medicines:             Monitored Anesthesia Care Complications:         No immediate complications. Procedure:             Pre-Anesthesia Assessment:                        - Prior to the procedure, a History and Physical was                         performed, and patient medications and allergies were                         reviewed. The patient is competent. The risks and                         benefits of the procedure and the sedation options and                         risks were discussed with the patient. All questions                         were answered and informed consent was obtained.                         Patient identification and proposed procedure were                         verified by the physician, the nurse, the                         anesthesiologist, the anesthetist and the technician                         in the endoscopy suite. Mental Status Examination:                         alert and oriented. Airway Examination: normal                         oropharyngeal airway and neck mobility. Respiratory                         Examination: clear to auscultation. CV Examination:  normal. Prophylactic Antibiotics: The patient does not                         require prophylactic antibiotics. Prior                         Anticoagulants: The patient has taken no  anticoagulant                         or antiplatelet agents. ASA Grade Assessment: II - A                         patient with mild systemic disease. After reviewing                         the risks and benefits, the patient was deemed in                         satisfactory condition to undergo the procedure. The                         anesthesia plan was to use monitored anesthesia care                         (MAC). Immediately prior to administration of                         medications, the patient was re-assessed for adequacy                         to receive sedatives. The heart rate, respiratory                         rate, oxygen saturations, blood pressure, adequacy of                         pulmonary ventilation, and response to care were                         monitored throughout the procedure. The physical                         status of the patient was re-assessed after the                         procedure.                        After obtaining informed consent, the endoscope was                         passed under direct vision. Throughout the procedure,                         the patient's blood pressure, pulse, and oxygen                         saturations were monitored continuously. The Endoscope  was introduced through the mouth, and advanced to the                         second part of duodenum. The upper GI endoscopy was                         somewhat difficult due to ineffective sedation. The                         patient tolerated the procedure well. Findings:      One benign-appearing, intrinsic mild stenosis was found. This stenosis       measured 1.2 cm (inner diameter) x less than one cm (in length). The       stenosis was traversed. A TTS dilator was passed through the scope.       Dilation with a 15-16.5-18 mm balloon dilator was performed to 16.5 mm.       The dilation site was examined and showed mild mucosal  disruption.       Estimated blood loss was minimal.      A medium-sized hiatal hernia was present.      The exam of the esophagus was otherwise normal.      The entire examined stomach was normal.      The examined duodenum was normal. Impression:            - Benign-appearing esophageal stenosis. Dilated.                        - Medium-sized hiatal hernia.                        - Normal stomach.                        - Normal examined duodenum.                        - No specimens collected. Recommendation:        - Discharge patient to home.                        - Resume previous diet.                        - Continue present medications.                        - Return to referring physician as previously                         scheduled. Procedure Code(s):     --- Professional ---                        [phone], Esophagogastroduodenoscopy, flexible,                         transoral; with transendoscopic balloon dilation of                         esophagus (less than 30 mm diameter) Diagnosis Code(s):     --- Professional ---  K22.2, Esophageal obstruction                        K44.9, Diaphragmatic hernia without obstruction or                         gangrene                        F45.8, Other somatoform disorders                        R93.3, Abnormal findings on diagnostic imaging of                         other parts of digestive tract CPT copyright 2022 [employer]. All rights reserved. The codes documented in this report are preliminary and upon coder review may  be revised to meet current compliance requirements. Ole Schick MD, MD 01/09/2024 10:06:37 AM Number of Addenda: 0 Note Initiated On: 01/09/2024 9:46 AM Estimated Blood Loss:  Estimated blood loss was minimal.      [facility]

## 2024-01-09 NOTE — Interval H&P Note (Signed)
 History and Physical Interval Note:  01/09/2024 9:41 AM  Curtis Krueger  has presented today for surgery, with the diagnosis of Abnormal barium swallow (R93.3) Globus pharyngeus (R09.A2).  The various methods of treatment have been discussed with the patient and family. After consideration of risks, benefits and other options for treatment, the patient has consented to  Procedures: EGD (ESOPHAGOGASTRODUODENOSCOPY) (N/A) as a surgical intervention.  The patient's history has been reviewed, patient examined, no change in status, stable for surgery.  I have reviewed the patient's chart and labs.  Questions were answered to the patient's satisfaction.     Ole ONEIDA Schick  Ok to proceed with EGD

## 2024-01-09 NOTE — Transfer of Care (Signed)
 Immediate Anesthesia Transfer of Care Note  Patient: Curtis Krueger  Procedure(s) Performed: EGD (ESOPHAGOGASTRODUODENOSCOPY) DILATION, ESOPHAGUS  Patient Location: PACU  Anesthesia Type:General  Level of Consciousness: awake  Airway & Oxygen Therapy: Patient Spontanous Breathing  Post-op Assessment: Report given to RN  Post vital signs: stable  Last Vitals:  Vitals Value Taken Time  BP    Temp    Pulse 87 01/09/24 10:06  Resp 12 01/09/24 10:06  SpO2 95 % 01/09/24 10:06    Last Pain:  Vitals:   01/09/24 0857  TempSrc: Temporal  PainSc: 0-No pain         Complications: No notable events documented.

## 2024-01-09 NOTE — H&P (Signed)
 Outpatient short stay form Pre-procedure 01/09/2024  Ole ONEIDA Schick, MD  Primary Physician: Eliverto Bette Hover, MD  Reason for visit:  Abnormal barium swallow  History of present illness:    83 y/o gentleman with history of hypertension and DM II here for EGD for abnormal barium swallow that shows a possible stricture. No trouble swallowing meats or breads but dry foods give him problems. No trouble with liquids. No neck surgeries. No blood thinners. Father had stomach cancer.   Current Medications[1]  Medications Prior to Admission  Medication Sig Dispense Refill Last Dose/Taking   aspirin EC 81 MG tablet Take 81 mg by mouth daily. Swallow whole.   01/08/2024   fluticasone (FLONASE) 50 MCG/ACT nasal spray Place 2 sprays into both nostrils daily.   01/08/2024   folic acid (FOLVITE) 1 MG tablet Take 1 mg by mouth daily.   01/08/2024   levocetirizine (XYZAL) 5 MG tablet Take 5 mg by mouth every evening.   01/08/2024   losartan (COZAAR) 100 MG tablet Take 100 mg by mouth daily.   01/08/2024   metFORMIN (GLUCOPHAGE) 500 MG tablet Take 850 mg by mouth 2 (two) times daily with a meal.    01/08/2024   Multiple Vitamins-Minerals (PRESERVISION AREDS 2+MULTI VIT PO) Take 1 tablet by mouth 2 (two) times daily with a meal.   01/08/2024   Potassium 99 MG TABS Take 1 tablet by mouth daily.   01/08/2024   rosuvastatin (CRESTOR) 40 MG tablet Take 40 mg by mouth daily.   01/08/2024   clobetasol cream (TEMOVATE) 0.05 % Apply 1 application topically 2 (two) times daily.      hydrochlorothiazide (HYDRODIURIL) 25 MG tablet Take 25 mg by mouth daily. (Patient not taking: Reported on 01/09/2024)   Not Taking   hydrOXYzine (ATARAX/VISTARIL) 25 MG tablet Take 25 mg by mouth every 6 (six) hours as needed.      naproxen sodium (ANAPROX) 220 MG tablet Take 220 mg by mouth as needed.      terbinafine (LAMISIL) 250 MG tablet Take 250 mg by mouth daily. (Patient not taking: Reported on 01/09/2024)   Completed  Course   triamcinolone  cream (KENALOG ) 0.1 % Apply 1 application topically 2 (two) times daily.        Allergies[2]   Past Medical History:  Diagnosis Date   Arthritis    hands, shoulder, knees   Diabetes mellitus without complication [facility])    ED (erectile dysfunction)    Hyperlipidemia    Hypertension    Seasonal allergies     Review of systems:  Otherwise negative.    Physical Exam  Gen: Alert, oriented. Appears stated age.  HEENT: PERRLA. Lungs: No respiratory distress CV: RRR Abd: soft, benign, no masses Ext: No edema   Planned procedures: Proceed with EGD. The patient understands the nature of the planned procedure, indications, risks, alternatives and potential complications including but not limited to bleeding, infection, perforation, damage to internal organs and possible oversedation/side effects from anesthesia. The patient agrees and gives consent to proceed.  Please refer to procedure notes for findings, recommendations and patient disposition/instructions.     Ole ONEIDA Schick, MD [facility]         [1]  Current Facility-Administered Medications:    0.9 %  sodium chloride  infusion, , Intravenous, Continuous, Chantz Montefusco, Ole ONEIDA, MD, Last Rate: 20 mL/hr at 01/09/24 0905, New Bag at 01/09/24 0905 [2] No Known Allergies

## 2024-01-09 NOTE — Anesthesia Preprocedure Evaluation (Signed)
 "                                  Anesthesia Evaluation  Patient identified by MRN, date of birth, ID band Patient awake    Reviewed: Allergy & Precautions, NPO status , Patient's Chart, lab work & pertinent test results  Airway Mallampati: II  TM Distance: >3 FB Neck ROM: Full    Dental  (+) Teeth Intact   Pulmonary neg pulmonary ROS, Patient abstained from smoking., former smoker   Pulmonary exam normal breath sounds clear to auscultation       Cardiovascular Exercise Tolerance: Good hypertension, Pt. on medications negative cardio ROS Normal cardiovascular exam Rhythm:Regular Rate:Normal     Neuro/Psych negative neurological ROS  negative psych ROS   GI/Hepatic negative GI ROS, Neg liver ROS,,,  Endo/Other  negative endocrine ROSdiabetes, Type 2, Oral Hypoglycemic Agents  Class 3 obesity  Renal/GU negative Renal ROS  negative genitourinary   Musculoskeletal   Abdominal  (+) + obese  Peds negative pediatric ROS (+)  Hematology negative hematology ROS (+)   Anesthesia Other Findings Past Medical History: No date: Arthritis     Comment:  hands, shoulder, knees No date: Diabetes mellitus without complication (HCC) No date: ED (erectile dysfunction) No date: Hyperlipidemia No date: Hypertension No date: Seasonal allergies  Past Surgical History: No date: APPENDECTOMY No date: CATARACT EXTRACTION 09/24/2020: COLONOSCOPY WITH PROPOFOL ; N/A     Comment:  Procedure: COLONOSCOPY WITH PROPOFOL ;  Surgeon: Dessa Reyes ORN, MD;  Location: [facility] ENDOSCOPY;  Service:               Endoscopy;  Laterality: N/A; 09/24/2020: ESOPHAGOGASTRODUODENOSCOPY (EGD) WITH PROPOFOL ; N/A     Comment:  Procedure: ESOPHAGOGASTRODUODENOSCOPY (EGD) WITH               PROPOFOL ;  Surgeon: Dessa Reyes ORN, MD;  Location:               [facility] ENDOSCOPY;  Service: Endoscopy;  Laterality: N/A; 12/02/2011: EUS     Comment:  Procedure: LOWER ENDOSCOPIC ULTRASOUND  (EUS);  Surgeon:               Toribio SHAUNNA Cedar, MD;  Location: [facility];  Service:               Endoscopy;  Laterality: N/A; No date: HAND SURGERY     Comment:  right hand, nodule removed on finger  09/27/2014: KNEE ARTHROSCOPY; Left     Comment:  Procedure: ARTHROSCOPY KNEE, chondral debridement with               coblation and medial menisectomy.;  Surgeon: Helayne Glenn, MD;  Location: [facility];  Service:               Orthopedics;  Laterality: Left;  Diabetic - oral meds No date: TONSILLECTOMY No date: VASECTOMY  BMI    Body Mass Index: 27.50 kg/m      Reproductive/Obstetrics negative OB ROS                              Anesthesia Physical Anesthesia Plan  ASA: 2  Anesthesia Plan: General   Post-op Pain Management:    Induction:  Intravenous  PONV Risk Score and Plan: Propofol  infusion and TIVA  Airway Management Planned: Natural Airway and Nasal Cannula  Additional Equipment:   Intra-op Plan:   Post-operative Plan:   Informed Consent: I have reviewed the patients History and Physical, chart, labs and discussed the procedure including the risks, benefits and alternatives for the proposed anesthesia with the patient or authorized representative who has indicated his/her understanding and acceptance.     Dental Advisory Given  Plan Discussed with: CRNA  Anesthesia Plan Comments:         Anesthesia Quick Evaluation  "

## 2024-01-09 NOTE — Anesthesia Postprocedure Evaluation (Signed)
"   Anesthesia Post Note  Patient: Curtis Krueger  Procedure(s) Performed: EGD (ESOPHAGOGASTRODUODENOSCOPY) DILATION, ESOPHAGUS  Anesthesia Type: General Anesthetic complications: no   No notable events documented.   Last Vitals:  Vitals:   01/09/24 1006 01/09/24 1016  BP: (!) 116/59 132/66  Pulse: 87 83  Resp: 12 20  Temp: (!) 36.4 C (!) 36.1 C  SpO2: 95% 97%    Last Pain:  Vitals:   01/09/24 1016  TempSrc: Temporal  PainSc: 0-No pain                 Curtis Krueger Curtis Krueger      "
